# Patient Record
Sex: Male | Born: 1935 | Race: White | Hispanic: No | Marital: Married | Smoking: Former smoker
Health system: Southern US, Community
[De-identification: ages and names within clinical notes are randomized; demographics above are authoritative.]

## PROBLEM LIST (undated history)

## (undated) DIAGNOSIS — F32A Depression, unspecified: Secondary | ICD-10-CM

## (undated) DIAGNOSIS — G20 Parkinson's disease: Secondary | ICD-10-CM

## (undated) DIAGNOSIS — R159 Full incontinence of feces: Secondary | ICD-10-CM

## (undated) DIAGNOSIS — I639 Cerebral infarction, unspecified: Secondary | ICD-10-CM

## (undated) DIAGNOSIS — I219 Acute myocardial infarction, unspecified: Secondary | ICD-10-CM

## (undated) DIAGNOSIS — F1010 Alcohol abuse, uncomplicated: Secondary | ICD-10-CM

## (undated) DIAGNOSIS — E7800 Pure hypercholesterolemia, unspecified: Secondary | ICD-10-CM

## (undated) DIAGNOSIS — E079 Disorder of thyroid, unspecified: Secondary | ICD-10-CM

## (undated) DIAGNOSIS — M1990 Unspecified osteoarthritis, unspecified site: Secondary | ICD-10-CM

## (undated) DIAGNOSIS — K219 Gastro-esophageal reflux disease without esophagitis: Secondary | ICD-10-CM

## (undated) DIAGNOSIS — F329 Major depressive disorder, single episode, unspecified: Secondary | ICD-10-CM

## (undated) DIAGNOSIS — F0390 Unspecified dementia without behavioral disturbance: Secondary | ICD-10-CM

## (undated) HISTORY — PX: CERVICAL SPINE SURGERY: SHX589

## (undated) HISTORY — PX: FOOT SURGERY: SHX648

## (undated) HISTORY — PX: OTHER SURGICAL HISTORY: SHX169

## (undated) HISTORY — PX: BACK SURGERY: SHX140

---

## 2016-08-27 ENCOUNTER — Emergency Department
Admission: EM | Admit: 2016-08-27 | Discharge: 2016-08-27 | Disposition: A | Discharge status: Home / Self Care | Payer: Medicare Other | Attending: Emergency Medicine | Admitting: Emergency Medicine

## 2016-08-27 ENCOUNTER — Emergency Department: Payer: Medicare Other

## 2016-08-27 DIAGNOSIS — R079 Chest pain, unspecified: Secondary | ICD-10-CM

## 2016-08-27 DIAGNOSIS — R072 Precordial pain: Secondary | ICD-10-CM | POA: Diagnosis not present

## 2016-08-27 HISTORY — DX: Cerebral infarction, unspecified: I63.9

## 2016-08-27 HISTORY — DX: Acute myocardial infarction, unspecified: I21.9

## 2016-08-27 LAB — CBC
HCT: 38.9 % — ABNORMAL LOW (ref 40.0–52.0)
HEMOGLOBIN: 13.4 g/dL (ref 13.0–18.0)
MCH: 32.4 pg (ref 26.0–34.0)
MCHC: 34.4 g/dL (ref 32.0–36.0)
MCV: 94.1 fL (ref 80.0–100.0)
PLATELETS: 222 10*3/uL (ref 150–440)
RBC: 4.13 MIL/uL — AB (ref 4.40–5.90)
RDW: 13 % (ref 11.5–14.5)
WBC: 7.6 10*3/uL (ref 3.8–10.6)

## 2016-08-27 LAB — BASIC METABOLIC PANEL
ANION GAP: 4 — AB (ref 5–15)
BUN: 21 mg/dL — ABNORMAL HIGH (ref 6–20)
CALCIUM: 9.6 mg/dL (ref 8.9–10.3)
CO2: 22 mmol/L (ref 22–32)
CREATININE: 1.34 mg/dL — AB (ref 0.61–1.24)
Chloride: 112 mmol/L — ABNORMAL HIGH (ref 101–111)
GFR, EST AFRICAN AMERICAN: 56 mL/min — AB (ref >60)
GFR, EST NON AFRICAN AMERICAN: 49 mL/min — AB (ref >60)
Glucose, Bld: 118 mg/dL — ABNORMAL HIGH (ref 65–99)
Potassium: 3.9 mmol/L (ref 3.5–5.1)
SODIUM: 138 mmol/L (ref 135–145)

## 2016-08-27 LAB — TROPONIN I

## 2016-08-27 NOTE — Discharge Instructions (Signed)
You have been seen in the emergency department today for chest pain. Your workup has shown normal results. As we discussed please follow-up with your primary care physician in the next 1-2 days for recheck. Return to the emergency department for any further chest pain, trouble breathing, or any other symptom personally concerning to yourself. °

## 2016-08-27 NOTE — ED Notes (Signed)
Answered call bell. Pt stated he didn't want anything, just wants to know what time it is.

## 2016-08-27 NOTE — ED Notes (Signed)
Patient is pain free at this time, no complaints

## 2016-08-27 NOTE — ED Triage Notes (Signed)
Pt came to ED via EMS c/o substernal chest pain starting this morning. Pt has history of MI and CVA w/ right sided deficits. Pt took 325mg  asa prior to EMS arrival. Pt reports pain is constant, some sob. HR 82, blood pressure 150/84. Cbg 133 per EMS.

## 2016-08-27 NOTE — ED Notes (Signed)
Christopher BreedingDavid Pollard Son: [phone]

## 2016-08-27 NOTE — ED Provider Notes (Signed)
[facility] Emergency Department Provider Note  Time seen: 10:05 AM  I have reviewed the triage vital signs and the nursing notes.   HISTORY  Chief Complaint Chest Pain    HPI Christopher Pollard is a 81 y.o. male with a past medical history of CVA, MI, presents the emergency department with chest pain. According to the patient he developed chest pain around 8 or 9:00 this morning. States it was moderate, located in the left chest, states shortness of breath but denies any nausea or diaphoresis. Patient states a history of MI in the past which this feels similar. Patient is an overall very poor his recall when his MI was, does not know who his cardiologist is, does not know what other medical conditions he has.  Past Medical History:  Diagnosis Date  . CVA (cerebral vascular accident) (HCC)   . MI (myocardial infarction)     There are no active problems to display for this patient.   No past surgical history on file.  Prior to Admission medications   Not on File    No Known Allergies  No family history on file.  Social History Social History  Substance Use Topics  . Smoking status: Unknown If Ever Smoked  . Smokeless tobacco: Never Used  . Alcohol use No    Review of Systems Constitutional: Negative for fever. Cardiovascular: Positive for chest pain which continues. Respiratory: Negative for shortness of breath. Gastrointestinal: Negative for abdominal pain Neurological: Negative for headache 10-point ROS otherwise negative.  ____________________________________________   PHYSICAL EXAM:  VITAL SIGNS: ED Triage Vitals [08/27/16 0954]  Enc Vitals Group     BP 137/79     Pulse Rate 82     Resp 18     Temp 98.4 F (36.9 C)     Temp Source Oral     SpO2 96 %     Weight      Height      Head Circumference      Peak Flow      Pain Score      Pain Loc      Pain Edu?      Excl. in GC?     Constitutional: Alert and oriented. Well  appearing and in no distress. Eyes: Normal exam ENT   Head: Normocephalic and atraumatic   Mouth/Throat: Mucous membranes are moist. Cardiovascular: Normal rate, regular rhythm. Respiratory: Normal respiratory effort without tachypnea nor retractions. Breath sounds are clear  Gastrointestinal: Soft and nontender. No distention Musculoskeletal: Nontender with normal range of motion in all extremities.  Neurologic:  Normal speech and language. No gross focal neurologic deficits  Skin:  Skin is warm, dry and intact.  Psychiatric: Mood and affect are normal ____________________________________________    EKG  EKG reviewed and interpreted by myself shows normal sinus rhythm at 84 bpm, narrow QRS, normal axis, normal intervals besides a prolonged QTC of 535 ms, nonspecific but no concerning ST changes.  ____________________________________________    RADIOLOGY  Chest x-ray negative  ____________________________________________   INITIAL IMPRESSION / ASSESSMENT AND PLAN / ED COURSE  Pertinent labs & imaging results that were available during my care of the patient were reviewed by me and considered in my medical decision making (see chart for details).  Patient presents to the emergency department for chest pain. States he continues to have mild left-sided chest discomfort. States this morning he was also experiencing shortness of breath. Overall patient is a poor historian. We will check labs including  troponin, chest x-ray in continue to closely monitor. Patient took 325 mg of aspirin prior to arrival. EKG does not appear to show any acute/concerning changes.  Son is now here with the patient states this is a fairly frequent complaint of the patient's. Patient denies any discomfort or symptoms currently. States he feels well. I discussed admission versus discharge home with 2 sets of cardiac enzymes. They would much prefer to be discharged home as long as a second set of cardiac  enzymes are negative.  Patient's repeat troponin is negative. Patient continues to be chest pain-free in the emergency department. I discussed results with the patient as well as his son and they wished to be discharged. We will discharge them home with cardiology follow-up. I discussed the normal chest pain return precautions.  ____________________________________________   FINAL CLINICAL IMPRESSION(S) / ED DIAGNOSES  Chest pain    Minna AntisKevin Oluwakemi Salsberry, MD 08/27/16 1322

## 2016-12-13 ENCOUNTER — Encounter: Payer: Self-pay | Admitting: Emergency Medicine

## 2016-12-13 ENCOUNTER — Ambulatory Visit
Admission: EM | Admit: 2016-12-13 | Discharge: 2016-12-13 | Disposition: A | Discharge status: Home / Self Care | Payer: Medicare Other | Attending: Emergency Medicine | Admitting: Emergency Medicine

## 2016-12-13 ENCOUNTER — Emergency Department
Admission: EM | Admit: 2016-12-13 | Discharge: 2016-12-13 | Disposition: A | Discharge status: Home / Self Care | Payer: Medicare Other | Attending: Emergency Medicine | Admitting: Emergency Medicine

## 2016-12-13 DIAGNOSIS — L089 Local infection of the skin and subcutaneous tissue, unspecified: Secondary | ICD-10-CM

## 2016-12-13 DIAGNOSIS — L89309 Pressure ulcer of unspecified buttock, unspecified stage: Secondary | ICD-10-CM | POA: Diagnosis not present

## 2016-12-13 DIAGNOSIS — N179 Acute kidney failure, unspecified: Secondary | ICD-10-CM

## 2016-12-13 DIAGNOSIS — L8995 Pressure ulcer of unspecified site, unstageable: Secondary | ICD-10-CM

## 2016-12-13 DIAGNOSIS — L89300 Pressure ulcer of unspecified buttock, unstageable: Secondary | ICD-10-CM | POA: Insufficient documentation

## 2016-12-13 DIAGNOSIS — R7989 Other specified abnormal findings of blood chemistry: Secondary | ICD-10-CM | POA: Diagnosis present

## 2016-12-13 HISTORY — DX: Parkinson's disease: G20

## 2016-12-13 HISTORY — DX: Pure hypercholesterolemia, unspecified: E78.00

## 2016-12-13 HISTORY — DX: Unspecified dementia, unspecified severity, without behavioral disturbance, psychotic disturbance, mood disturbance, and anxiety: F03.90

## 2016-12-13 LAB — BASIC METABOLIC PANEL
Anion gap: 7 (ref 5–15)
BUN: 32 mg/dL — AB (ref 6–20)
CALCIUM: 9.5 mg/dL (ref 8.9–10.3)
CHLORIDE: 111 mmol/L (ref 101–111)
CO2: 21 mmol/L — ABNORMAL LOW (ref 22–32)
CREATININE: 1.61 mg/dL — AB (ref 0.61–1.24)
GFR calc Af Amer: 45 mL/min — ABNORMAL LOW (ref >60)
GFR, EST NON AFRICAN AMERICAN: 39 mL/min — AB (ref >60)
Glucose, Bld: 119 mg/dL — ABNORMAL HIGH (ref 65–99)
Potassium: 4.9 mmol/L (ref 3.5–5.1)
SODIUM: 139 mmol/L (ref 135–145)

## 2016-12-13 LAB — CBC WITH DIFFERENTIAL/PLATELET
BASOS PCT: 1 %
Basophils Absolute: 0.1 10*3/uL (ref 0–0.1)
EOS ABS: 1.2 10*3/uL — AB (ref 0–0.7)
EOS PCT: 13 %
HCT: 38.9 % — ABNORMAL LOW (ref 40.0–52.0)
Hemoglobin: 13 g/dL (ref 13.0–18.0)
LYMPHS ABS: 0.5 10*3/uL — AB (ref 1.0–3.6)
Lymphocytes Relative: 5 %
MCH: 31.6 pg (ref 26.0–34.0)
MCHC: 33.4 g/dL (ref 32.0–36.0)
MCV: 94.8 fL (ref 80.0–100.0)
MONO ABS: 0.5 10*3/uL (ref 0.2–1.0)
MONOS PCT: 6 %
Neutro Abs: 7.2 10*3/uL — ABNORMAL HIGH (ref 1.4–6.5)
Neutrophils Relative %: 75 %
PLATELETS: 195 10*3/uL (ref 150–440)
RBC: 4.11 MIL/uL — ABNORMAL LOW (ref 4.40–5.90)
RDW: 13.9 % (ref 11.5–14.5)
WBC: 9.5 10*3/uL (ref 3.8–10.6)

## 2016-12-13 MED ORDER — METRONIDAZOLE 500 MG PO TABS: 500.0000 mg | ORAL_TABLET | Freq: Three times a day (TID) | ORAL | 0 refills | Status: AC

## 2016-12-13 MED ORDER — DOXYCYCLINE HYCLATE 100 MG PO CAPS: 100.0000 mg | ORAL_CAPSULE | Freq: Two times a day (BID) | ORAL | 0 refills | Status: AC

## 2016-12-13 MED ORDER — MUPIROCIN 2 % EX OINT: 1.0000 "application " | TOPICAL_OINTMENT | Freq: Three times a day (TID) | CUTANEOUS | 0 refills | Status: AC

## 2016-12-13 NOTE — Discharge Instructions (Signed)
Go to the [facility] emergency room. I will call them and let them know that you are on your way.

## 2016-12-13 NOTE — Discharge Instructions (Signed)
As discussed I think Mr. Christopher Pollard would benefit greatly from being seen [facility]. Please seek medical attention for any high fevers, chest pain, shortness of breath, change in behavior, persistent vomiting, bloody stool or any other new or concerning symptoms.

## 2016-12-13 NOTE — ED Triage Notes (Signed)
Pt here with son.  Sent from urgent care for possible concern of infected pressure ulcer.  Son describes wound as quarter size and draining yellowish color. urgent care did labs and creatinine slightly elevated compared to previus so sent for evaluation.   Unable to view wound in triage.

## 2016-12-13 NOTE — ED Provider Notes (Signed)
[facility] Emergency Department Provider Note   ____________________________________________   I have reviewed the triage vital signs and the nursing notes.   HISTORY  Chief Complaint Abnormal Lab   History limited by: Not Limited   HPI Christopher HoopsRobert Piper is a 81 y.o. male who presents to the emergency department today because of concern for elevated creatinine. The patient was seen at urgent care today because of concern for pressure ulcers. At urgent care the patient was diagnosed with possible cellulitis and given prescription for antibiotics. In addition he is found to have an elevated creatinine of 1.6. Urgent care doctor was not able to compare to any recent creatinines. Sent to the emergency department for possible IV fluid hydration.   Past Medical History:  Diagnosis Date  . CVA (cerebral vascular accident) (HCC)   . Dementia   . Hypercholesteremia   . MI (myocardial infarction) (HCC)   . Parkinson disease (HCC)     There are no active problems to display for this patient.   History reviewed. No pertinent surgical history.  Prior to Admission medications   Medication Sig Start Date End Date Taking? Authorizing Provider  doxycycline (VIBRAMYCIN) 100 MG capsule Take 1 capsule (100 mg total) by mouth 2 (two) times daily. 12/13/16 12/23/16  Domenick GongMortenson, Ashley, MD  metroNIDAZOLE (FLAGYL) 500 MG tablet Take 1 tablet (500 mg total) by mouth 3 (three) times daily. 12/13/16 12/23/16  Domenick GongMortenson, Ashley, MD  mupirocin ointment (BACTROBAN) 2 % Apply 1 application topically 3 (three) times daily. 12/13/16   Domenick GongMortenson, Ashley, MD    Allergies Niacin and related; Adhesive [tape]; Quetiapine; and Quinine derivatives  History reviewed. No pertinent family history.  Social History Social History  Substance Use Topics  . Smoking status: Unknown If Ever Smoked  . Smokeless tobacco: Never Used  . Alcohol use No    Review of Systems Constitutional: No  fever/chills Eyes: No visual changes. ENT: No sore throat. Cardiovascular: Denies chest pain. Respiratory: Denies shortness of breath. Gastrointestinal: No abdominal pain.   Musculoskeletal: Positive for pain in the buttocks Skin: Positive for wounds of the buttocks Neurological: Negative for headaches, focal weakness or numbness.  ____________________________________________   PHYSICAL EXAM:  VITAL SIGNS: ED Triage Vitals  Enc Vitals Group     BP 12/13/16 1819 113/75     Pulse Rate 12/13/16 1819 95     Resp 12/13/16 1819 18     Temp 12/13/16 1819 97.7 F (36.5 C)     Temp Source 12/13/16 1819 Oral     SpO2 12/13/16 1819 99 %     Weight 12/13/16 1820 178 lb (80.7 kg)     Height 12/13/16 1820 5\' 10"  (1.778 m)   Constitutional: Alert and oriented. Well appearing and in no distress. Eyes: Conjunctivae are normal.  ENT   Head: Normocephalic and atraumatic.   Nose: No congestion/rhinnorhea.   Mouth/Throat: Mucous membranes are moist.   Neck: No stridor. Hematological/Lymphatic/Immunilogical: No cervical lymphadenopathy. Cardiovascular: Normal rate, regular rhythm.  No murmurs, rubs, or gallops.  Respiratory: Normal respiratory effort without tachypnea nor retractions. Breath sounds are clear and equal bilaterally. No wheezes/rales/rhonchi. Gastrointestinal: Soft and non tender. No rebound. No guarding.  Genitourinary: Deferred Musculoskeletal: Normal range of motion in all extremities. No lower extremity edema. Neurologic:  Normal speech and language. No gross focal neurologic deficits are appreciated.  Skin:  Multiple pressure ulcers of buttocks, some erythema surrounding one of the pressure ulcers Psychiatric: Mood and affect are normal. Speech and behavior are normal.  Patient exhibits appropriate insight and judgment.  ____________________________________________    LABS (pertinent  positives/negatives)  None  ____________________________________________   EKG  None  ____________________________________________    RADIOLOGY  None  ____________________________________________   PROCEDURES  Procedures  ____________________________________________   INITIAL IMPRESSION / ASSESSMENT AND PLAN / ED COURSE  Pertinent labs & imaging results that were available during my care of the patient were reviewed by me and considered in my medical decision making (see chart for details).  Patient sent from urgent care because of concerns for elevated creatinine. However based on care everywhere patient most recently had a creatinine of 1.6. It does. Has baseline elevation. This point I have less concern for acute kidney injury. Terms of the pressure ulcers do agree that oral antibiotics is warranted. This point do not think any emergent imaging is necessary. Will give patient follow up information for wound care center.  ____________________________________________   FINAL CLINICAL IMPRESSION(S) / ED DIAGNOSES  Final diagnoses:  Decubitus ulcer of buttock, unstageable, unspecified laterality (HCC)     Note: This dictation was prepared with Dragon dictation. Any transcriptional errors that result from this process are unintentional     Phineas Semen, MD 12/13/16 2116

## 2016-12-13 NOTE — ED Provider Notes (Signed)
HPI  SUBJECTIVE:  Christopher Pollard is a 81 y.o. male who presents with perirectal burning for the past 4-6 weeks. Caregiver and wife notes painful pimple/ boils around the groin area and on his buttocks starting several weeks ago. States that they've been rupturing and draining and healing on their own. One lesion  took approximately a month to heal. The worst lesion on his right buttock has been present for approximately a week. They state that it started off as a pustule. Wife notes several new places this morning. They have been giving him Tylenol 500 mg 3 times a day, last dose was within 8 hours of evaluation.  using barrier cream with some improvement in symptoms. Symptoms are worse with sitting. He has stool incontinence at baseline. Caregiver reports decreased appetite. No fevers, bodyaches, altered mental status, diarrhea. Patient was treated for a UTI 2-3 weeks ago, but had the perirectal burning before the antibiotics. Patient has a past medical history of CVA, MI, dementia, fecal incontinence and has a live-in caregiver. His wife is wheelchair bound. Caregiver states that she turns patient on a regular basis and that he does get up and walks around. Patient does wear diapers. No history of diabetes, MRSA, herpes, abscesses, boils, ulcerative colitis, Crohn's. PMD: Dr. Tamera Punt at the [facility].  All history obtained from caregiver and wife.  Past Medical History:  Diagnosis Date  . CVA (cerebral vascular accident) (HCC)   . MI (myocardial infarction) (HCC)     History reviewed. No pertinent surgical history.  No family history on file.  Social History  Substance Use Topics  . Smoking status: Unknown If Ever Smoked  . Smokeless tobacco: Never Used  . Alcohol use No    No current facility-administered medications for this encounter.   Current Outpatient Prescriptions:  .  doxycycline (VIBRAMYCIN) 100 MG capsule, Take 1 capsule (100 mg total) by mouth 2 (two) times daily., Disp: 20  capsule, Rfl: 0 .  metroNIDAZOLE (FLAGYL) 500 MG tablet, Take 1 tablet (500 mg total) by mouth 3 (three) times daily., Disp: 30 tablet, Rfl: 0 .  mupirocin ointment (BACTROBAN) 2 %, Apply 1 application topically 3 (three) times daily., Disp: 22 g, Rfl: 0  No Known Allergies   ROS  As noted in HPI.   Physical Exam  BP 103/76 (BP Location: Right Arm)   Pulse 97   Temp 98.1 F (36.7 C) (Oral)   Resp 16   Ht 5\' 10"  (1.778 m)   Wt 178 lb (80.7 kg)   SpO2 100%   BMI 25.54 kg/m   Constitutional: Well developed, well nourished, no acute distress. Nonverbal Eyes:  EOMI, conjunctiva normal bilaterally HENT: Normocephalic, atraumatic,mucus membranes moist Respiratory: Normal inspiratory effort Cardiovascular: Normal rate GI: nondistended skin: Tender area of induration of right buttock with central lesion draining purulent material  and no fluctuance. This appears to be an infected pressure ulcer. He also has other, small pustular lesions along the groin and inner thighs. Positive erythematous, macerated perirectal skin. + fecal incontinence. See pictures            Musculoskeletal: no deformities Neurologic: Alert & oriented x 3, no focal neuro deficits Psychiatric: Speech and behavior appropriate   ED Course   Medications - No data to display  Orders Placed This Encounter  Procedures  . Deep Wound or Surgical Culture (Abcess or Tissue Only)    Standing Status:   Standing    Number of Occurrences:   1    Order Specific  Question:   Patient immune status    Answer:   Normal  . CBC with Differential    Standing Status:   Standing    Number of Occurrences:   1  . Basic metabolic panel    Standing Status:   Standing    Number of Occurrences:   1    Results for orders placed or performed during the hospital encounter of 12/13/16 (from the past 24 hour(s))  CBC with Differential     Status: Abnormal   Collection Time: 12/13/16  3:41 PM  Result Value Ref Range    WBC 9.5 3.8 - 10.6 K/uL   RBC 4.11 (L) 4.40 - 5.90 MIL/uL   Hemoglobin 13.0 13.0 - 18.0 g/dL   HCT 09.838.9 (L) 11.940.0 - 14.752.0 %   MCV 94.8 80.0 - 100.0 fL   MCH 31.6 26.0 - 34.0 pg   MCHC 33.4 32.0 - 36.0 g/dL   RDW 82.913.9 56.211.5 - 13.014.5 %   Platelets 195 150 - 440 K/uL   Neutrophils Relative % 75 %   Neutro Abs 7.2 (H) 1.4 - 6.5 K/uL   Lymphocytes Relative 5 %   Lymphs Abs 0.5 (L) 1.0 - 3.6 K/uL   Monocytes Relative 6 %   Monocytes Absolute 0.5 0.2 - 1.0 K/uL   Eosinophils Relative 13 %   Eosinophils Absolute 1.2 (H) 0 - 0.7 K/uL   Basophils Relative 1 %   Basophils Absolute 0.1 0 - 0.1 K/uL  Basic metabolic panel     Status: Abnormal   Collection Time: 12/13/16  3:41 PM  Result Value Ref Range   Sodium 139 135 - 145 mmol/L   Potassium 4.9 3.5 - 5.1 mmol/L   Chloride 111 101 - 111 mmol/L   CO2 21 (L) 22 - 32 mmol/L   Glucose, Bld 119 (H) 65 - 99 mg/dL   BUN 32 (H) 6 - 20 mg/dL   Creatinine, Ser 8.651.61 (H) 0.61 - 1.24 mg/dL   Calcium 9.5 8.9 - 78.410.3 mg/dL   GFR calc non Af Amer 39 (L) >60 mL/min   GFR calc Af Amer 45 (L) >60 mL/min   Anion gap 7 5 - 15   No results found.  ED Clinical Impression  Acute kidney injury (HCC)  Infected pressure ulcer, unstageable (HCC)   ED Assessment/Plan  Suspect MRSA folliculitis/cellulitis that has now turned into an unstageable pressure wound. Will check CBC, BMP to evaluate for leukocytosis and also for kidney function. He is afebrile, but he took Tylenol within 6-8 hours of evaluation. However he is not tachycardic.   Plan to send home with doxycycline for 10 days, Flagyl 500 3 times a day for the infected pressure ulcer, Bactroban for the other pustules. Holding off on the Diflucan given his kidney function. Suspected the patient has a contact dermatitis from his stool incontinence with a secondary yeast infection.   Care everywhere labs reviewed. Patient had normal kidney function in 2006. There are no other recent labs.  Wife and caregiver  both state that the patient's kidney function was normal when he had his labs checked 2 weeks ago. Unsure as to etiology of his acute kidney injury, suspect dehydration, but advised them to go to the ED for at least IV fluids and creatinine remeasurement. Consider doing advanced imaging to determine the extent of the infection/pressure ulcer to rule out rectal fistula . They have [facility]. He is stable to go via private vehicle.  We'll rx  the Flagyl, doxycycline and Bactroban in case the ED sends him home.  Discussed labs, MDM, rationale for transfer to the ED with wife and caregiver. They agree with plan.   Meds ordered this encounter  Medications  . metroNIDAZOLE (FLAGYL) 500 MG tablet    Sig: Take 1 tablet (500 mg total) by mouth 3 (three) times daily.    Dispense:  30 tablet    Refill:  0  . doxycycline (VIBRAMYCIN) 100 MG capsule    Sig: Take 1 capsule (100 mg total) by mouth 2 (two) times daily.    Dispense:  20 capsule    Refill:  0  . mupirocin ointment (BACTROBAN) 2 %    Sig: Apply 1 application topically 3 (three) times daily.    Dispense:  22 g    Refill:  0    *This clinic note was created using Scientist, clinical (histocompatibility and immunogenetics). Therefore, there may be occasional mistakes despite careful proofreading.  ?   Domenick Gong, MD 12/13/16 [phone]

## 2016-12-13 NOTE — ED Notes (Signed)
Pt soiled during assessment.  Patient was changed and wiped clean with cleansing wipes.  New diaper placed on patient.

## 2016-12-13 NOTE — ED Triage Notes (Signed)
Discussed with dr Derrill Kaygoodman. No further orders.

## 2016-12-13 NOTE — ED Triage Notes (Signed)
Per wife cyst on patient right butt cheek x 2 week and burning at his rectum.

## 2016-12-16 ENCOUNTER — Telehealth: Payer: Self-pay

## 2016-12-16 NOTE — Telephone Encounter (Signed)
Pt's wife clld in requesting WBC results and range for pt. Advsd wife of results. Pt's stated she understood and had no other questions.

## 2016-12-19 LAB — AEROBIC/ANAEROBIC CULTURE W GRAM STAIN (SURGICAL/DEEP WOUND): Special Requests: NORMAL

## 2016-12-19 LAB — AEROBIC/ANAEROBIC CULTURE (SURGICAL/DEEP WOUND)

## 2017-02-25 ENCOUNTER — Encounter: Payer: Self-pay | Admitting: Gynecology

## 2017-02-25 ENCOUNTER — Ambulatory Visit
Admission: EM | Admit: 2017-02-25 | Discharge: 2017-02-25 | Disposition: A | Discharge status: Home / Self Care | Payer: Medicare Other | Attending: Family Medicine | Admitting: Family Medicine

## 2017-02-25 DIAGNOSIS — L0231 Cutaneous abscess of buttock: Secondary | ICD-10-CM

## 2017-02-25 HISTORY — DX: Unspecified osteoarthritis, unspecified site: M19.90

## 2017-02-25 HISTORY — DX: Alcohol abuse, uncomplicated: F10.10

## 2017-02-25 HISTORY — DX: Disorder of thyroid, unspecified: E07.9

## 2017-02-25 HISTORY — DX: Gastro-esophageal reflux disease without esophagitis: K21.9

## 2017-02-25 HISTORY — DX: Full incontinence of feces: R15.9

## 2017-02-25 HISTORY — DX: Depression, unspecified: F32.A

## 2017-02-25 HISTORY — DX: Major depressive disorder, single episode, unspecified: F32.9

## 2017-02-25 MED ORDER — MUPIROCIN CALCIUM 2 % EX CREA: 1.0000 "application " | TOPICAL_CREAM | Freq: Two times a day (BID) | CUTANEOUS | 0 refills | Status: AC

## 2017-02-25 MED ORDER — DOXYCYCLINE HYCLATE 100 MG PO CAPS: 100.0000 mg | ORAL_CAPSULE | Freq: Two times a day (BID) | ORAL | 0 refills | Status: AC

## 2017-02-25 MED ORDER — METRONIDAZOLE 500 MG PO TABS: 500.0000 mg | ORAL_TABLET | Freq: Three times a day (TID) | ORAL | 0 refills | Status: AC

## 2017-02-25 NOTE — Discharge Instructions (Signed)
-  Doxycycline: one tablet twice a day for 10 days -Flagyl: one tablet three times a day for 10 days -Bactoban: apply 2 times a day with dressing changes -continue with wound care per home health -follow up with PCP in 7-10 days -Tylenol or ibuprofen for pain

## 2017-02-25 NOTE — ED Triage Notes (Signed)
Per Aid, patient with recurrent Decubitus Ulcer on buttocks.

## 2017-02-25 NOTE — ED Provider Notes (Signed)
[facility]    CSN: [id number] Arrival date & time: 02/25/17  1004     History   Chief Complaint Chief Complaint  Patient presents with  . Skin Ulcer    HPI Christopher Pollard is a 81 y.o. male.   Patient is a 81-year-old male with history of stroke with left-sided weakness who presents with his aide with complaint of a boils/ulcers to his buttocks that had been very painful. Patient has not wanting to eat much due to the pain. Patient was seen back in June for the same issues, that time growing MRSA that was treated with doxycycline, Flagyl, and Bactroban cream with good results per the aide. Home health came and saw the patient earlier this week and was noted just to have a boil type lesion with a head on it. At that time plan was to treat with barrier type cream and powder to keep the area dry and clean after discussing with the patient's physician. Since then, the aide says the lesion on the right inner thigh/buttock has popped and has had some purulent drainage. Patient also has a small boil to the left buttocks as well. Patient has had no fever.      Past Medical History:  Diagnosis Date  . Alcohol abuse   . Arthritis   . Bowel incontinence   . CVA (cerebral vascular accident) (HCC)   . Dementia   . Depression   . GERD (gastroesophageal reflux disease)   . Hypercholesteremia   . MI (myocardial infarction) (HCC)   . Parkinson disease (HCC)   . Thyroid disease     There are no active problems to display for this patient.   Past Surgical History:  Procedure Laterality Date  . BACK SURGERY    . CERVICAL SPINE SURGERY    . FOOT SURGERY    . peacemaker         Home Medications    Prior to Admission medications   Medication Sig Start Date End Date Taking? Authorizing Provider  acetaminophen (TYLENOL) 650 MG CR tablet Take 650 mg by mouth every 8 (eight) hours as needed for pain.   Yes [provider]  apixaban (ELIQUIS) 5 MG TABS tablet Take 5  mg by mouth 2 (two) times daily.   Yes [provider]  aspirin EC 81 MG tablet Take 81 mg by mouth daily.   Yes [provider]  carbidopa-levodopa (SINEMET IR) 25-100 MG tablet Take 2 tablets by mouth 2 (two) times daily.   Yes [provider]  cholecalciferol (VITAMIN Pollard) 1000 units tablet Take 1,000 Units by mouth daily.   Yes [provider]  levothyroxine (SYNTHROID, LEVOTHROID) 150 MCG tablet Take 150 mcg by mouth daily before breakfast.   Yes [provider]  pantoprazole (PROTONIX) 40 MG tablet Take 40 mg by mouth 2 (two) times daily.   Yes [provider]  polyethylene glycol (MIRALAX / GLYCOLAX) packet Take 17 g by mouth 2 (two) times daily.   Yes [provider]  simvastatin (ZOCOR) 80 MG tablet Take 80 mg by mouth daily at 6 PM.   Yes [provider]  topiramate (TOPAMAX) 50 MG tablet Take 50 mg by mouth daily.   Yes [provider]  venlafaxine XR (EFFEXOR-XR) 75 MG 24 hr capsule Take 75 mg by mouth daily with breakfast.   Yes [provider]  vitamin B-12 (CYANOCOBALAMIN) 1000 MCG tablet Take 1,000 mcg by mouth daily.   Yes [provider]  doxycycline (VIBRAMYCIN) 100 MG capsule Take 1 capsule (100 mg total) by mouth 2 (two) times daily. 02/25/17   Candis Schatz, Christopher Pollard  metroNIDAZOLE (FLAGYL) 500 MG tablet Take 1 tablet (500 mg total) by mouth 3 (three) times daily. 02/25/17 03/04/17  Candis Schatz, Christopher Pollard  mirtazapine (REMERON) 7.5 MG tablet Take 7.5 mg by mouth at bedtime.    [provider]  mupirocin cream (BACTROBAN) 2 % Apply 1 application topically 2 (two) times daily. 02/25/17   Candis Schatz, Christopher Pollard  mupirocin ointment (BACTROBAN) 2 % Apply 1 application topically 3 (three) times daily. Patient not taking: Reported on 12/13/2016 12/13/16   Domenick Gong, MD    Family History No family history on file.  Social History Social History  Substance Use Topics  .  Smoking status: Current Every Day Smoker  . Smokeless tobacco: Never Used  . Alcohol use No     Allergies   Niacin and related; Adhesive [tape]; Quetiapine; and Quinine derivatives   Review of Systems Review of Systems  As noted above in history of present illness. Other systems reviewed and found to be negative.   Physical Exam Triage Vital Signs ED Triage Vitals  Enc Vitals Group     BP 02/25/17 1032 (!) 132/96     Pulse Rate 02/25/17 1032 (!) 102     Resp 02/25/17 1032 16     Temp 02/25/17 1032 98.2 F (36.8 C)     Temp Source 02/25/17 1032 Oral     SpO2 02/25/17 1032 98 %     Weight --      Height --      Head Circumference --      Peak Flow --      Pain Score 02/25/17 1034 7     Pain Loc --      Pain Edu? --      Excl. in GC? --    No data found.   Updated Vital Signs BP (!) 132/96 (BP Location: Left Arm)   Pulse (!) 102   Temp 98.2 F (36.8 C) (Oral)   Resp 16   SpO2 98%   Physical Exam  Constitutional: He appears well-developed and well-nourished. No distress.  HENT:  Head: Normocephalic and atraumatic.  Eyes: Pupils are equal, round, and reactive to light. EOM are normal.  Neck: Neck supple.  Cardiovascular: Normal rate.   Pulmonary/Chest: Effort normal.  Musculoskeletal:  Left-sided weakness but is able to stand and pivot with assistance  Skin:        UC Treatments / Results  Labs (all labs ordered are listed, but only abnormal results are displayed) Labs Reviewed  AEROBIC CULTURE (SUPERFICIAL SPECIMEN)    EKG  EKG Interpretation None       Radiology No results found.  Procedures .Marland KitchenIncision and Drainage Date/Time: 02/25/2017 11:55 AM Performed by: Sherrlyn Hock Pollard Authorized by: Hassan Rowan   Consent:    Consent obtained:  Verbal   Consent given by:  Patient   Risks discussed:  Bleeding, incomplete drainage and pain Location:    Type:  Abscess   Location: right inner thigh/buttock. Pre-procedure details:    Skin  preparation:  Chloraprep Anesthesia (see MAR for exact dosages):    Anesthesia method: lidocaine. Procedure type:    Complexity:  Simple Procedure details:    Needle aspiration: no     Incision types:  Stab incision   Incision depth:  Dermal   Scalpel blade:  11   Wound management:  Probed and deloculated   Drainage:  Bloody and purulent   Drainage amount: minimal.   Wound treatment:  Wound left open (covered with triple antibiotic ointment and non-adhesive dressing)   Packing materials:  None   (including critical care time)  Medications Ordered in UC Medications - No data to display   Initial Impression / Assessment and Plan / UC Course  I have reviewed the triage vital signs and the nursing notes.  Pertinent labs & imaging results that were available during my care of the patient were reviewed by me and considered in my medical decision making (see chart for details).   patient with minimally draining abscess to the inside of the right thigh/buttock and a small intact boil to the left buttock cheek near the cleft. Small incision, as above, with return of minimal purulent drainage. Cultures taken. Will send a culture and go ahead and prescribe the patient doxycycline, Flagyl, and Bactroban since that, a short well previously. Will have patient continue to follow with home health of his primary care provider.  Final Clinical Impressions(s) / UC Diagnoses   Final diagnoses:  Cutaneous abscess of buttock    New Prescriptions Discharge Medication List as of 02/25/2017 11:43 AM    START taking these medications   Details  doxycycline (VIBRAMYCIN) 100 MG capsule Take 1 capsule (100 mg total) by mouth 2 (two) times daily., Starting Sat 02/25/2017, Normal    metroNIDAZOLE (FLAGYL) 500 MG tablet Take 1 tablet (500 mg total) by mouth 3 (three) times daily., Starting Sat 02/25/2017, Until Sat 03/04/2017, Normal    mupirocin cream (BACTROBAN) 2 % Apply 1 application topically 2 (two) times  daily., Starting Sat 02/25/2017, Normal         Controlled Substance Prescriptions [place] Controlled Substance Registry consulted? Not Applicable   Candis SchatzMichael Pollard Keedan Sample, Christopher Pollard    Candis SchatzHarris, Christopher Mcneice Pollard, Christopher Pollard 02/25/17 1157

## 2017-02-28 LAB — AEROBIC CULTURE W GRAM STAIN (SUPERFICIAL SPECIMEN)

## 2017-02-28 LAB — AEROBIC CULTURE  (SUPERFICIAL SPECIMEN)

## 2017-07-08 IMAGING — CR DG CHEST 2V
2 series · 2 of 2 positions shown · non-contrast
Comparison: None.

CLINICAL DATA: Substernal chest pain

EXAM:
CHEST  2 VIEW

[chest lat]
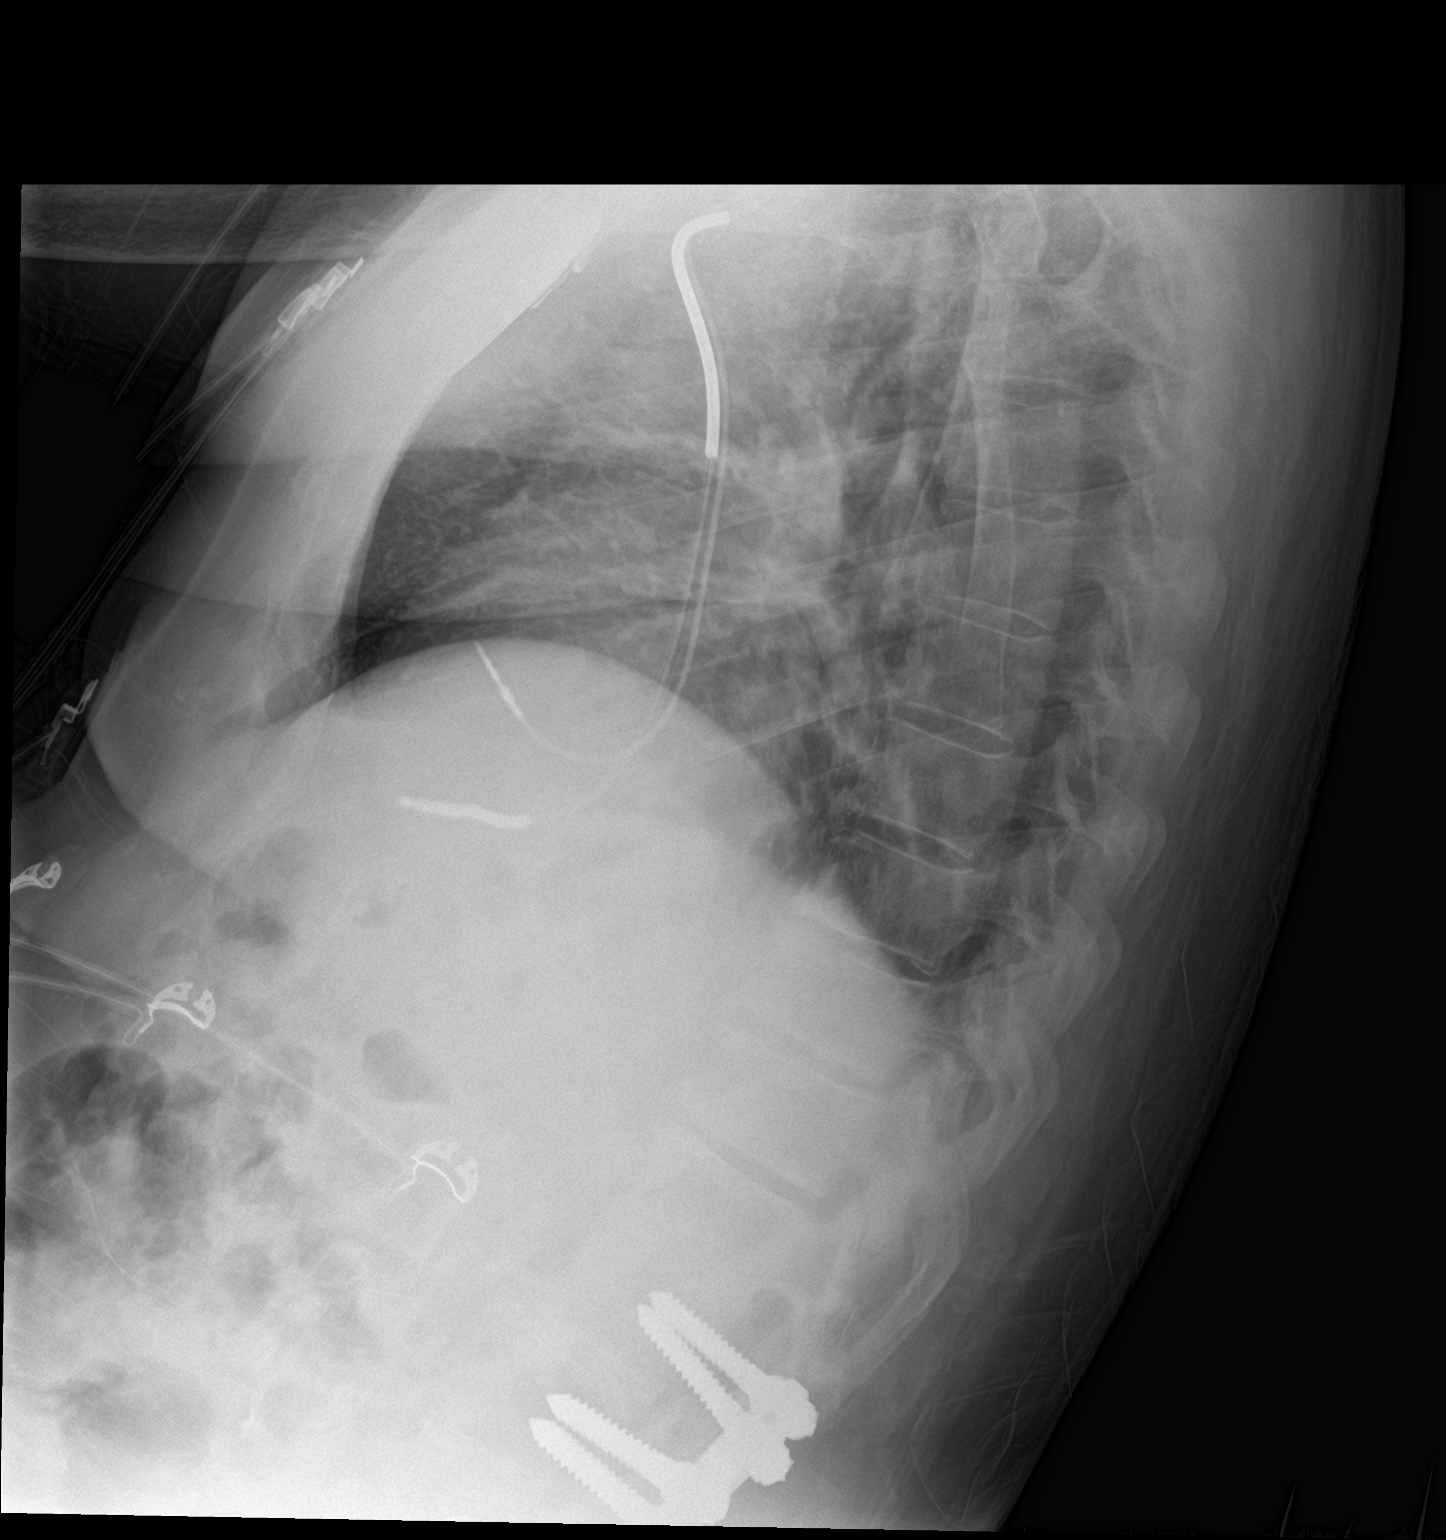

[chest ap]
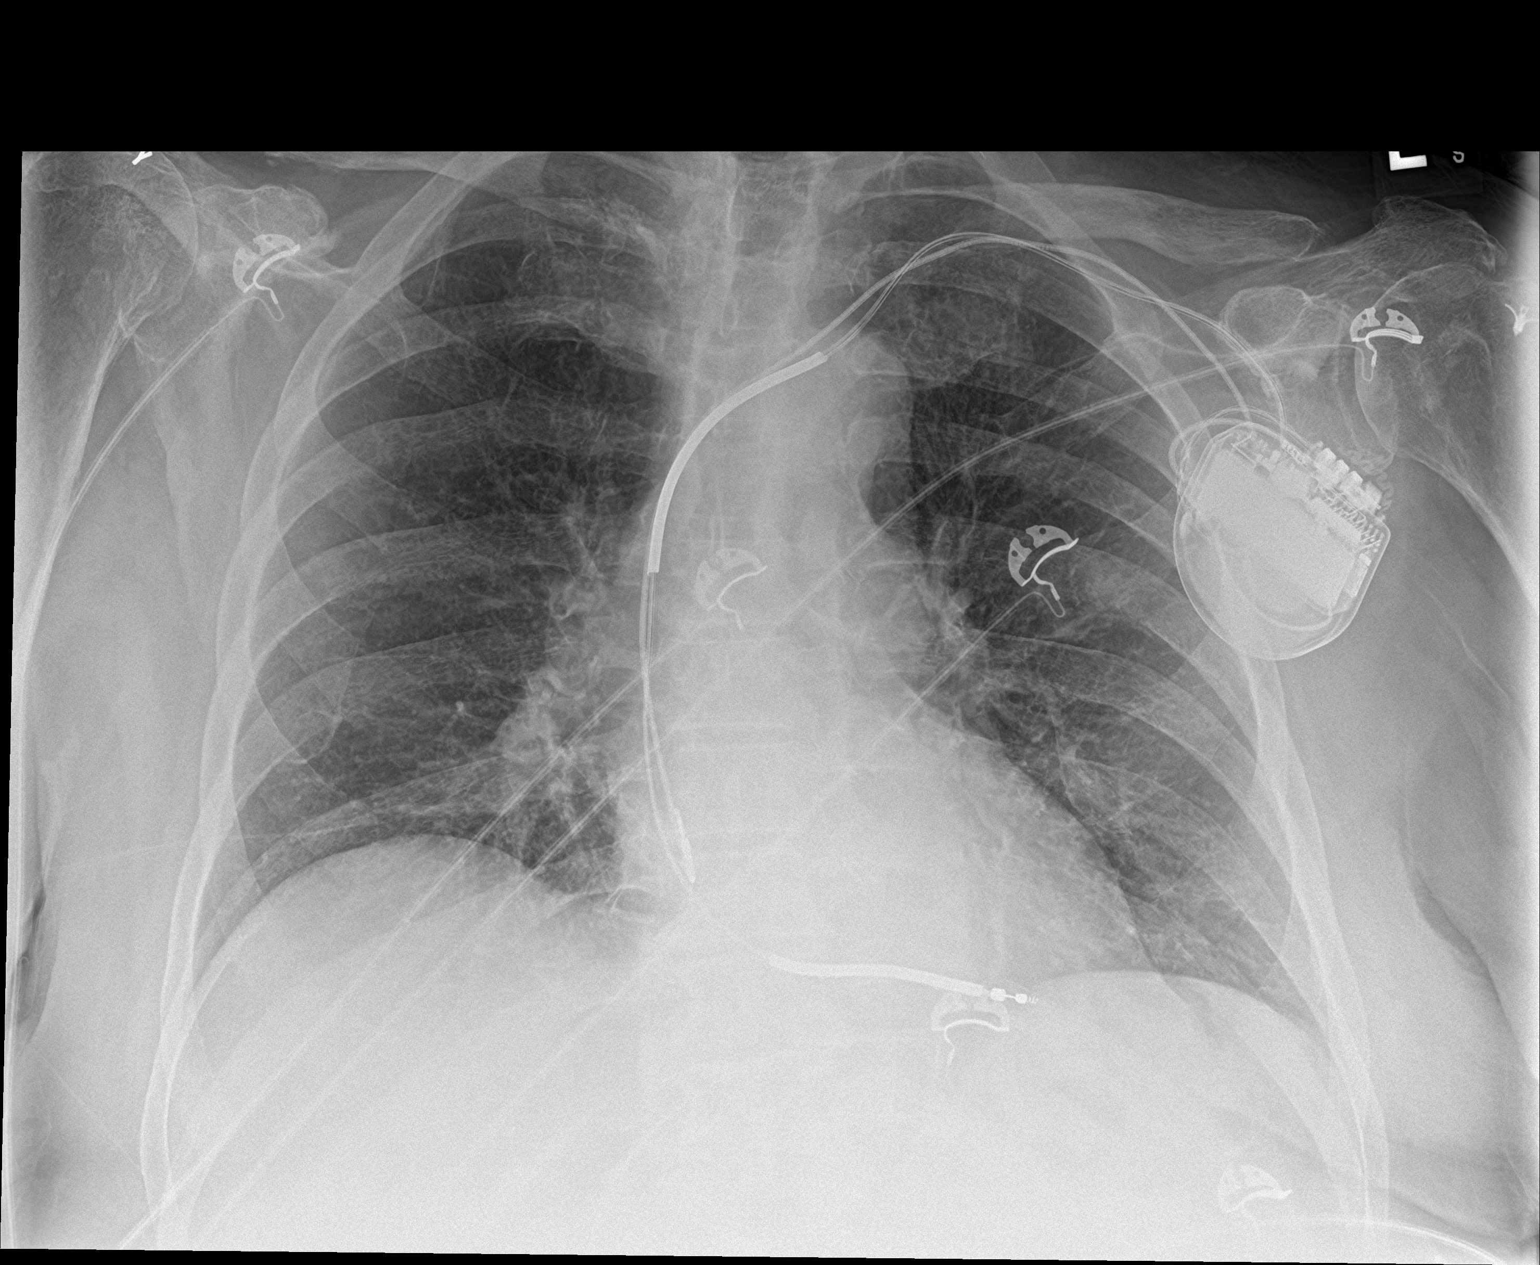

[2 of 2 positions shown; findings below may reference images not displayed]

FINDINGS: Defibrillator is noted in satisfactory position. Cardiac shadow is
within normal limits. The lungs are clear bilaterally. No acute bony
abnormality is seen. Postsurgical changes in the lumbar spine are
noted.
IMPRESSION: No acute abnormality noted.

## 2017-10-09 ENCOUNTER — Other Ambulatory Visit: Payer: Self-pay

## 2017-10-09 ENCOUNTER — Ambulatory Visit (INDEPENDENT_AMBULATORY_CARE_PROVIDER_SITE_OTHER): Payer: Medicare Other

## 2017-10-09 ENCOUNTER — Ambulatory Visit
Admission: EM | Admit: 2017-10-09 | Discharge: 2017-10-09 | Disposition: A | Discharge status: Home / Self Care | Payer: Medicare Other | Attending: Family Medicine | Admitting: Family Medicine

## 2017-10-09 DIAGNOSIS — R000 Tachycardia, unspecified: Secondary | ICD-10-CM

## 2017-10-09 DIAGNOSIS — R410 Disorientation, unspecified: Secondary | ICD-10-CM

## 2017-10-09 DIAGNOSIS — F05 Delirium due to known physiological condition: Secondary | ICD-10-CM

## 2017-10-09 NOTE — ED Triage Notes (Addendum)
Wife and cargegiver with pt report they are concerned for either a URI or aspiration pneumonia. Pt had a choking episode a few days ago and has a cough (has chronic cough). Wife reports increase in confusion over past few days. Pt is incontinent and unable to provide a urine sample and will require catheterization per wife and caregiver

## 2017-10-09 NOTE — Discharge Instructions (Signed)
Recommend patient go to Emergency Department for further evaluation and management °

## 2017-11-15 NOTE — ED Provider Notes (Signed)
[facility]    CSN: [id number] Arrival date & time: 10/09/17  1153     History   Chief Complaint Chief Complaint  Patient presents with  . Altered Mental Status    HPI Christopher Pollard is a 82 y.o. male.   82 yo male with a h/o multiple chronic medical problems presents with wife and caregiver with a complaint of increasing confusion over the past few days.  The history is provided by the spouse and a relative.  Altered Mental Status    Past Medical History:  Diagnosis Date  . Alcohol abuse   . Arthritis   . Bowel incontinence   . CVA (cerebral vascular accident) (HCC)   . Dementia   . Depression   . GERD (gastroesophageal reflux disease)   . Hypercholesteremia   . MI (myocardial infarction) (HCC)   . Parkinson disease (HCC)   . Thyroid disease     There are no active problems to display for this patient.   Past Surgical History:  Procedure Laterality Date  . BACK SURGERY    . CERVICAL SPINE SURGERY    . FOOT SURGERY    . peacemaker         Home Medications    Prior to Admission medications   Medication Sig Start Date End Date Taking? Authorizing Provider  acetaminophen (TYLENOL) 650 MG CR tablet Take 650 mg by mouth every 8 (eight) hours as needed for pain.    [provider]  apixaban (ELIQUIS) 5 MG TABS tablet Take 5 mg by mouth 2 (two) times daily.    [provider]  aspirin EC 81 MG tablet Take 81 mg by mouth daily.    [provider]  carbidopa-levodopa (SINEMET IR) 25-100 MG tablet Take 2 tablets by mouth 2 (two) times daily.    [provider]  cholecalciferol (VITAMIN D) 1000 units tablet Take 1,000 Units by mouth daily.    [provider]  doxycycline (VIBRAMYCIN) 100 MG capsule Take 1 capsule (100 mg total) by mouth 2 (two) times daily. 02/25/17   Candis Schatz, PA-C  levothyroxine (SYNTHROID, LEVOTHROID) 150 MCG tablet Take 150 mcg by mouth daily before breakfast.    [provider]  mirtazapine (REMERON) 7.5 MG tablet Take 7.5 mg by mouth at bedtime.    [provider]  mupirocin cream (BACTROBAN) 2 % Apply 1 application topically 2 (two) times daily. 02/25/17   Candis Schatz, PA-C  mupirocin ointment (BACTROBAN) 2 % Apply 1 application topically 3 (three) times daily. Patient not taking: Reported on 12/13/2016 12/13/16   Domenick Gong, MD  pantoprazole (PROTONIX) 40 MG tablet Take 40 mg by mouth 2 (two) times daily.    [provider]  polyethylene glycol (MIRALAX / GLYCOLAX) packet Take 17 g by mouth 2 (two) times daily.    [provider]  simvastatin (ZOCOR) 80 MG tablet Take 80 mg by mouth daily at 6 PM.    [provider]  topiramate (TOPAMAX) 50 MG tablet Take 50 mg by mouth daily.    [provider]  venlafaxine XR (EFFEXOR-XR) 75 MG 24 hr capsule Take 75 mg by mouth daily with breakfast.    [provider]  vitamin B-12 (CYANOCOBALAMIN) 1000 MCG tablet Take 1,000 mcg by mouth daily.    [provider]    Family History History reviewed. No pertinent family history.  Social History Social History   Tobacco Use  . Smoking status: Former Smoker  Last attempt to quit: 07/11/2017    Years since quitting: 0.3  . Smokeless tobacco: Never Used  Substance Use Topics  . Alcohol use: No  . Drug use: Not on file     Allergies   Niacin and related; Adhesive [tape]; Quetiapine; and Quinine derivatives   Review of Systems Review of Systems   Physical Exam Triage Vital Signs ED Triage Vitals  Enc Vitals Group     BP 10/09/17 1221 (!) 129/94     Pulse Rate 10/09/17 1221 (!) 110     Resp 10/09/17 1221 20     Temp 10/09/17 1221 (!) 97.5 F (36.4 C)     Temp Source 10/09/17 1221 Oral     SpO2 10/09/17 1221 97 %     Weight 10/09/17 1222 171 lb (77.6 kg)     Height 10/09/17 1222  (1.803 m)     Head Circumference --      Peak Flow --      Pain Score 10/09/17 1222 0      Pain Loc --      Pain Edu? --      Excl. in GC? --    No data found.  Updated Vital Signs BP (!) 129/94 (BP Location: Right Arm)   Pulse (!) 110   Temp (!) 97.5 F (36.4 C) (Oral)   Resp 20   Ht  (1.803 m)   Wt 171 lb (77.6 kg)   SpO2 97%   BMI 23.85 kg/m   Visual Acuity Right Eye Distance:   Left Eye Distance:   Bilateral Distance:    Right Eye Near:   Left Eye Near:    Bilateral Near:     Physical Exam  Constitutional: He appears well-developed and well-nourished. No distress.  Neurological: He is alert. He is disoriented. He displays normal reflexes. No cranial nerve deficit. He exhibits normal muscle tone. Coordination normal.  Skin: He is not diaphoretic.  Nursing note and vitals reviewed.    UC Treatments / Results  Labs (all labs ordered are listed, but only abnormal results are displayed) Labs Reviewed - No data to display  EKG None  Radiology No results found.  Procedures Procedures (including critical care time)  Medications Ordered in UC Medications - No data to display  Initial Impression / Assessment and Plan / UC Course  I have reviewed the triage vital signs and the nursing notes.  Pertinent labs & imaging results that were available during my care of the patient were reviewed by me and considered in my medical decision making (see chart for details).     Final Clinical Impressions(s) / UC Diagnoses   Final diagnoses:  Acute confusional state  Tachycardia     Discharge Instructions     Recommend patient go to Emergency Department for further evaluation and management    ED Prescriptions    None     1. Possible diagnoses reviewed with spouse and caregiver; recommend patient go to Emergency Department for further evaluation and management. 2. Controlled Substance Prescriptions [place] Controlled Substance Registry consulted? Not Applicable   Payton Mccallum, MD 11/15/17 1311

## 2018-08-20 IMAGING — CR DG CHEST 2V
2 series · 2 of 2 positions shown · non-contrast
Comparison: PA and lateral chest x-ray August 27, 2016

CLINICAL DATA: Confusion and productive cough for the past week.
History of aspiration and pneumonia, CVA, previous MI, former
smoker.

EXAM:
CHEST - 2 VIEW

[chest lat]
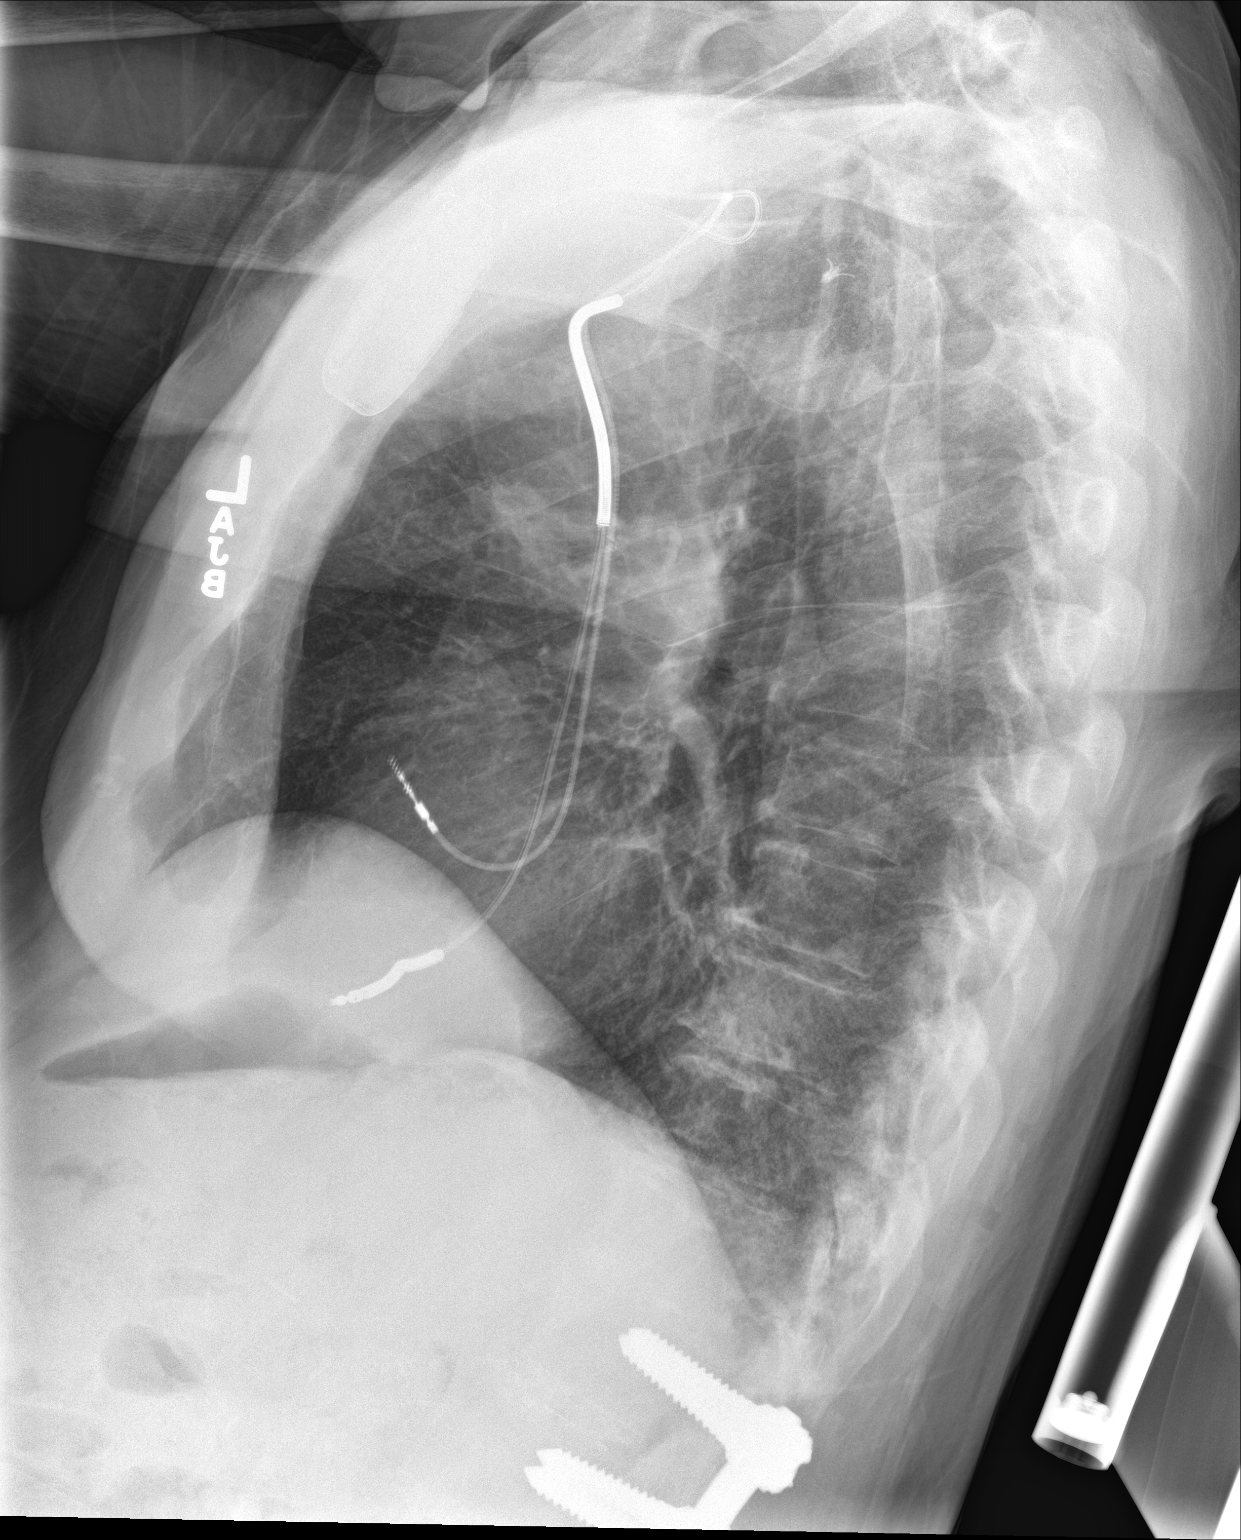

[chest ap]
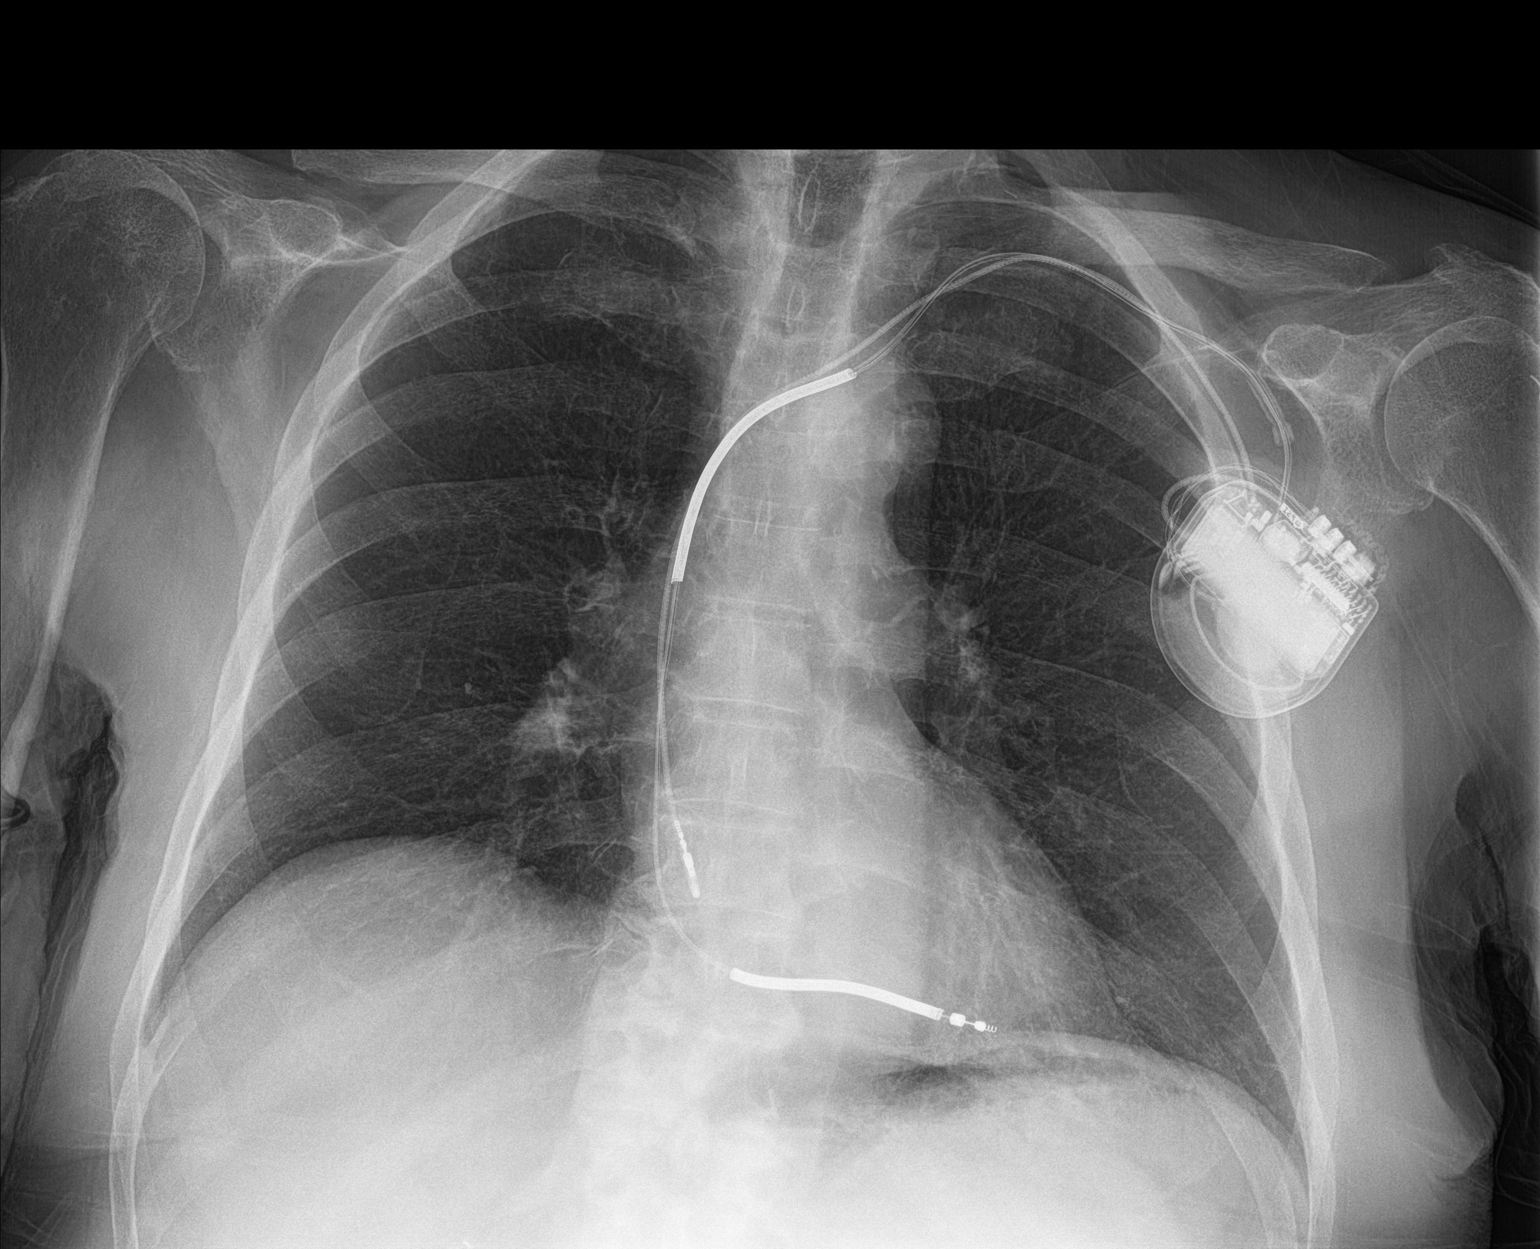

[2 of 2 positions shown; findings below may reference images not displayed]

FINDINGS: The lungs are adequately inflated and clear. The heart and pulmonary
vascularity are normal. The ICD is in stable position. There is no
pleural effusion. The bony thorax exhibits no acute abnormality.
IMPRESSION: There is no pneumonia, CHF, nor other acute cardiopulmonary
abnormality.

## 2020-10-18 DEATH — deceased
